# Patient Record
Sex: Female | Born: 1980 | Marital: Married | Smoking: Never smoker
Health system: Southern US, Community
[De-identification: ages and names within clinical notes are randomized; demographics above are authoritative.]

## PROBLEM LIST (undated history)

## (undated) DIAGNOSIS — Z789 Other specified health status: Secondary | ICD-10-CM

## (undated) HISTORY — PX: NO PAST SURGERIES: SHX2092

---

## 2017-08-03 ENCOUNTER — Other Ambulatory Visit (HOSPITAL_COMMUNITY): Payer: Self-pay | Admitting: MS"

## 2017-08-03 ENCOUNTER — Other Ambulatory Visit (HOSPITAL_COMMUNITY): Payer: Self-pay | Admitting: Obstetrics and Gynecology

## 2017-08-03 DIAGNOSIS — Z148 Genetic carrier of other disease: Secondary | ICD-10-CM

## 2017-08-03 DIAGNOSIS — O352XX0 Maternal care for (suspected) hereditary disease in fetus, not applicable or unspecified: Secondary | ICD-10-CM

## 2017-08-09 ENCOUNTER — Encounter (HOSPITAL_COMMUNITY): Payer: Self-pay | Admitting: *Deleted

## 2017-08-11 ENCOUNTER — Ambulatory Visit (HOSPITAL_COMMUNITY)
Admission: RE | Admit: 2017-08-11 | Discharge: 2017-08-11 | Disposition: A | Discharge status: Home / Self Care | Payer: BLUE CROSS/BLUE SHIELD | Source: Ambulatory Visit | Attending: Obstetrics and Gynecology | Admitting: Obstetrics and Gynecology

## 2017-08-11 ENCOUNTER — Encounter (HOSPITAL_COMMUNITY): Payer: Self-pay

## 2017-08-11 DIAGNOSIS — Z3A10 10 weeks gestation of pregnancy: Secondary | ICD-10-CM | POA: Diagnosis not present

## 2017-08-11 DIAGNOSIS — Z148 Genetic carrier of other disease: Secondary | ICD-10-CM

## 2017-08-11 DIAGNOSIS — Z87798 Personal history of other (corrected) congenital malformations: Secondary | ICD-10-CM | POA: Diagnosis not present

## 2017-08-11 DIAGNOSIS — O352XX0 Maternal care for (suspected) hereditary disease in fetus, not applicable or unspecified: Secondary | ICD-10-CM | POA: Insufficient documentation

## 2017-08-11 HISTORY — DX: Other specified health status: Z78.9

## 2017-08-11 LAB — ROUTINE CHROMOSOME - KARYOTYPE + FISH

## 2017-08-11 IMAGING — US US CHORIONIC VILLUS SAMPLING W/ US GUIDANCE - [facility] NRPT
1 series · 12 of 12 positions shown · non-contrast
Comparison: none

[Series 1: us chorionic villus sampling w/ us guidance - [facility] · 12 acquisitions, 12 frames shown]
[im 1/12]
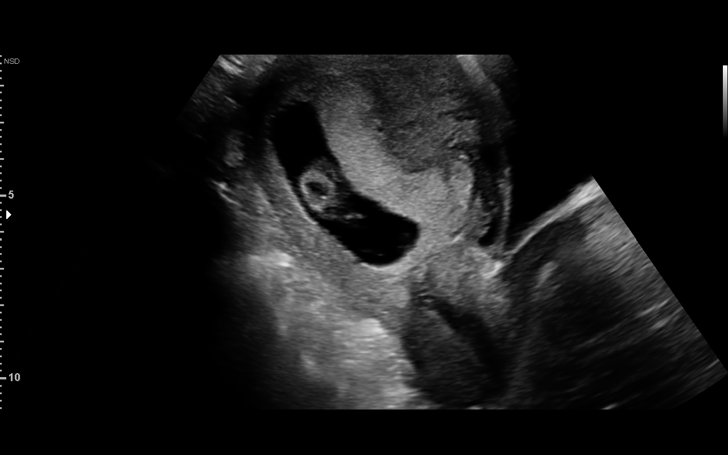
[im 2/12]
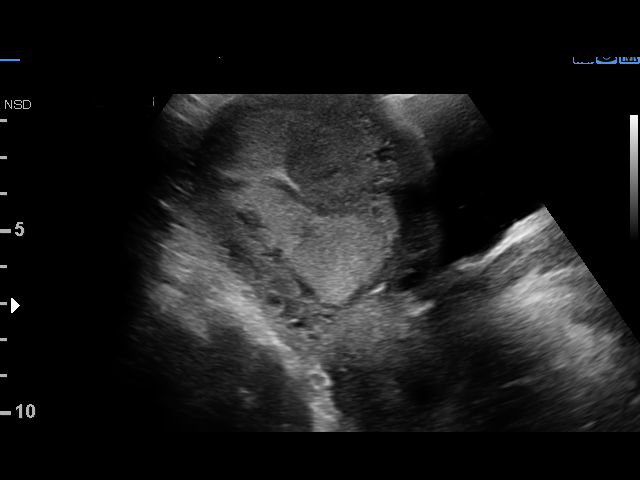
[im 3/12]
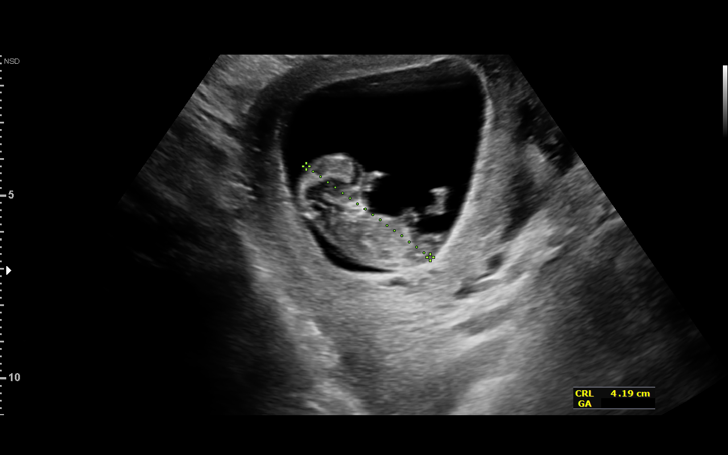
[im 4/12]
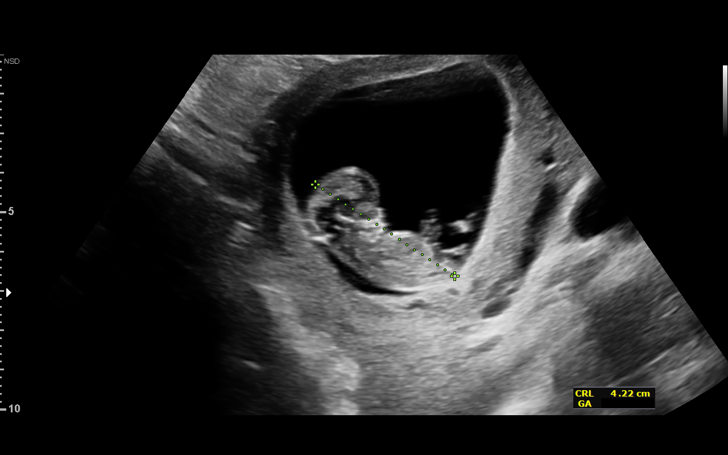
[im 5/12]
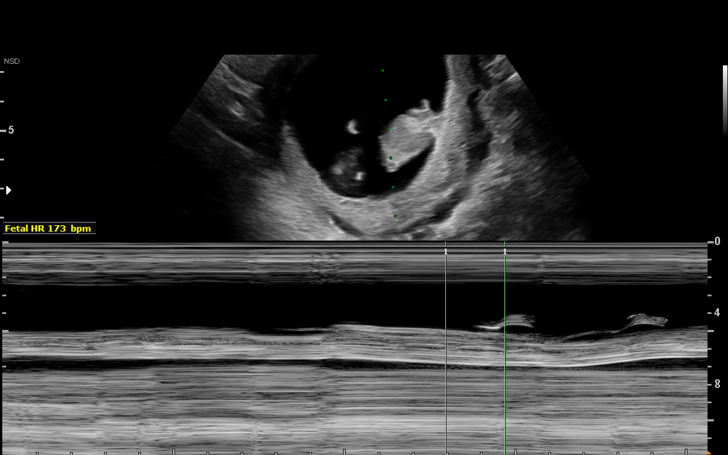
[im 6/12]
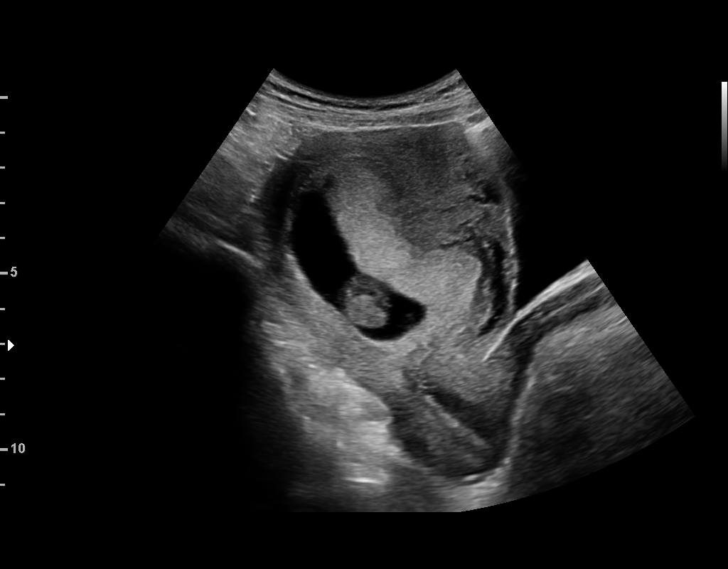
[im 7/12]
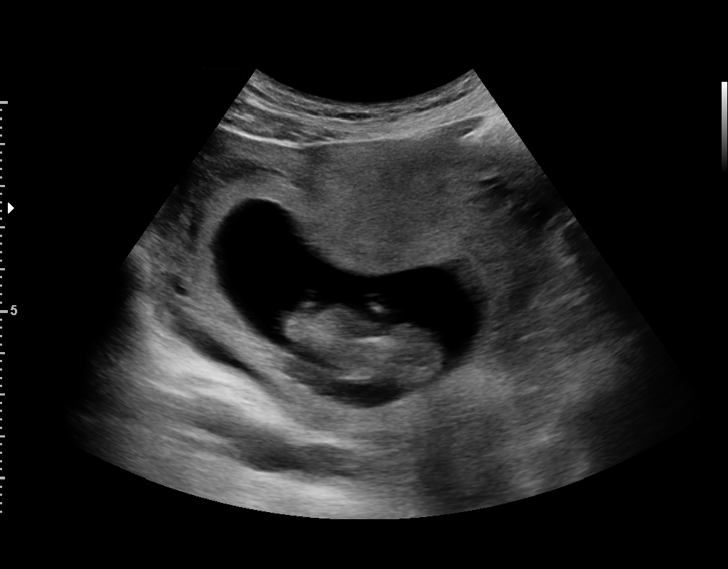
[im 8/12]
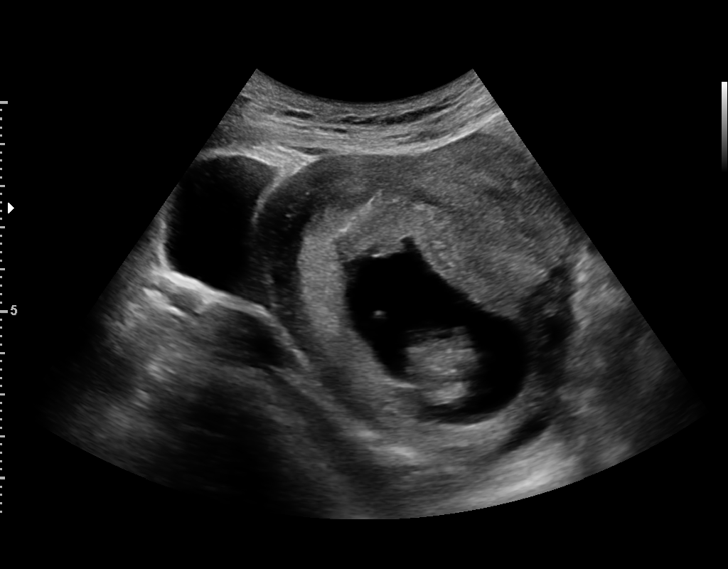
[im 9/12]
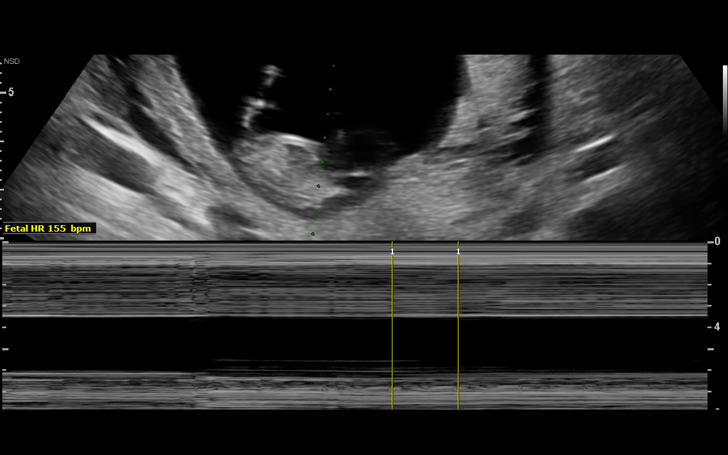
[im 10/12]
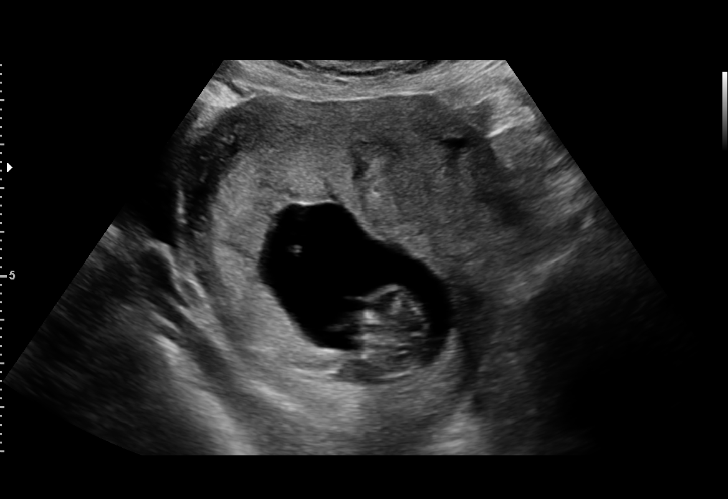
[im 11/12]
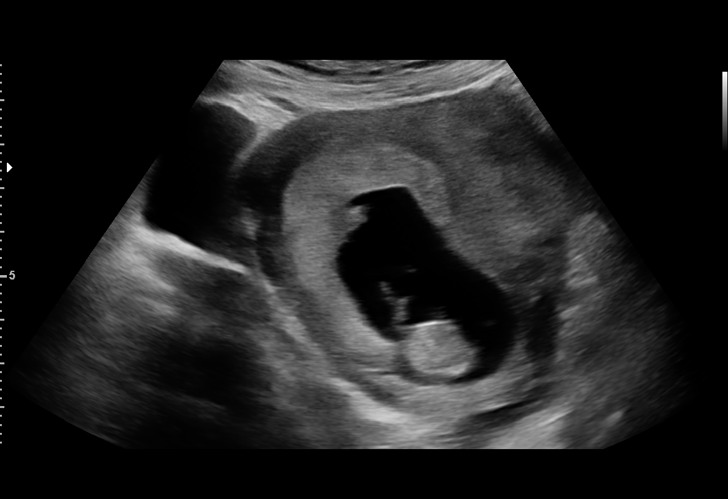
[im 12/12]
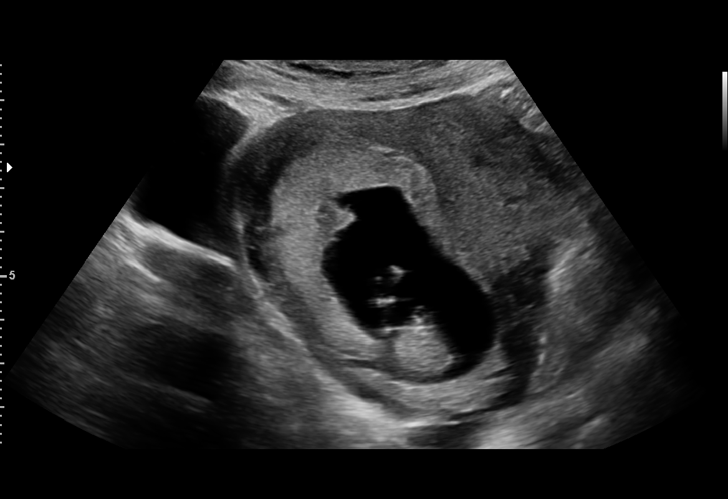

[12 of 12 positions shown; findings below may reference images not displayed]

[facility]

[facility]
[REDACTED] [address]
[place], [place]  [JA]

SAMPLING W/US GUIDANCE

1  [facility]          [PHONE_NUMBER]      [PHONE_NUMBER]     [PHONE_NUMBER]
Indications

10 weeks gestation of pregnancy
History of congenital or genetic condition     [JA]
(Fragile X carrier)
Maternal care for (suspected) hereditary       [JA]
disease in fetus, not applicable or
unspecified
OB History

Blood Type:   O+       Height:         Weight (lb):            BMI:
Gravidity:    3         Term:   2        Prem:   0        SAB:   0
TOP:          0       Ectopic:  0        Living: 2
Fetal Evaluation

Num Of Fetuses:     1
Fetal Heart         173
Rate(bpm):
Cardiac Activity:   Observed
Presentation:       Variable
Placenta:           Anterior

Amniotic Fluid
AFI FV:      Subjectively within normal limits
Gestational Age

LMP:           10w 1d        Date:  [DATE]                 EDD:   [DATE]
Best:          10w 1d     Det. By:  LMP  ([DATE])          EDD:   [DATE]
Guided Procedures

Type:   CVS-Transabdominal

FH Post Procedure:     Normal             RH Type:          O+
Rh Immune Globulin:    Not required,      Discharge Inst.:  Post-procedure
Rh positive                          instructions
given
Needle Insertions:     20 gauge x 2
mg Withdrawn:     Adequate
sample of villi
Transabdominal:        Yes

Complications:  None

Comment:                    CVS performed after obtaining informed consent and performing  a "time-out"
Cervix Uterus Adnexa

Cervix
Normal appearance by transabdominal scan.
Comments

The risks, benefits, and alternatives of CVS were discussed
with the patient including a 1 in 500 risk of pregnancy loss.
She provided written and verbal consent to the procedure.  A
transabdominal CVS was performed without complication as
described above.

After the procedure a small (less than 1 cm) area of bleeding
was noted adjacent to the placenta near the needle insertion
site.  It was observed for several minutes and noted to be
stable is size indicating appropriate hemostasis.
Impression

Single living intrauterine pregnancy at 10 weeks 1 day.
Normal amniotic fluid volume.
Successful transabdominal CVS.
Recommendations

Recommend follow-up ultrasound examination in
approximately 8 weeks for detailed fetal anatomic survey.

## 2017-08-15 ENCOUNTER — Telehealth (HOSPITAL_COMMUNITY): Payer: Self-pay | Admitting: MS"

## 2017-08-15 NOTE — Telephone Encounter (Signed)
Called Kylie Patel to discuss the preliminary FISH results for aneuploidy from [facility].  We reviewed that these are within normal limits.  We again discussed the limitations of FISH and that final results are still pending as [facility] and will be available in 1-2 weeks.  Patient reported that she has not noticed any concerning symptoms or complications following the procedure.  All questions were answered to Kylie Patel satisfaction, she was encouraged to call with additional questions or concerns.  Quinn PlowmanKaren Eduin Friedel MS Patent attorneyCertified Genetic Counselor

## 2017-08-24 ENCOUNTER — Telehealth (HOSPITAL_COMMUNITY): Payer: Self-pay | Admitting: MS"

## 2017-08-24 NOTE — Telephone Encounter (Signed)
Called Ms. 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 Reviewed that no full mutation was detected. 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 Maternal Cell contamination studies were positive for 1% maternal cells and 99% fetal cells. Thus, the presence of two alleles rather than just one is likely due to the [facility]. Discussed that amniocentesis is available if patient desired clarification of these results but that based on this result the fetus is not likely to be affected with a full mutation for Fragile X. The patient expressed her understanding in the limitations and stated that she was comfortable with these results, declining amniocentesis in the pregnancy.  Clydie BraunKaren Leamon Palau 08/24/2017 4:58 PM

## 2017-08-30 ENCOUNTER — Other Ambulatory Visit: Payer: Self-pay

## 2018-05-31 ENCOUNTER — Encounter (HOSPITAL_COMMUNITY): Payer: Self-pay
